# Patient Record
Sex: Female | Born: 1980 | Race: Black or African American | Hispanic: No | Marital: Married | Smoking: Never smoker
Health system: Southern US, Community
[De-identification: ages and names within clinical notes are randomized; demographics above are authoritative.]

## PROBLEM LIST (undated history)

## (undated) DIAGNOSIS — Z124 Encounter for screening for malignant neoplasm of cervix: Secondary | ICD-10-CM

## (undated) DIAGNOSIS — E039 Hypothyroidism, unspecified: Secondary | ICD-10-CM

## (undated) DIAGNOSIS — N926 Irregular menstruation, unspecified: Secondary | ICD-10-CM

## (undated) HISTORY — DX: Irregular menstruation, unspecified: N92.6

## (undated) HISTORY — PX: HERNIA REPAIR: SHX51

## (undated) HISTORY — DX: Hypothyroidism, unspecified: E03.9

## (undated) HISTORY — DX: Encounter for screening for malignant neoplasm of cervix: Z12.4

---

## 2003-10-12 ENCOUNTER — Other Ambulatory Visit: Admission: RE | Admit: 2003-10-12 | Discharge: 2003-10-12 | Payer: Self-pay | Admitting: Obstetrics and Gynecology

## 2004-02-19 ENCOUNTER — Ambulatory Visit (HOSPITAL_COMMUNITY): Admission: RE | Admit: 2004-02-19 | Discharge: 2004-02-19 | Payer: Self-pay | Admitting: Obstetrics & Gynecology

## 2004-06-17 ENCOUNTER — Inpatient Hospital Stay (HOSPITAL_COMMUNITY): Admission: AD | Admit: 2004-06-17 | Discharge: 2004-06-17 | Payer: Self-pay | Admitting: Obstetrics & Gynecology

## 2004-06-17 ENCOUNTER — Inpatient Hospital Stay (HOSPITAL_COMMUNITY): Admission: AD | Admit: 2004-06-17 | Discharge: 2004-06-17 | Payer: Self-pay | Admitting: Obstetrics

## 2004-06-24 ENCOUNTER — Inpatient Hospital Stay (HOSPITAL_COMMUNITY): Admission: AD | Admit: 2004-06-24 | Discharge: 2004-06-24 | Payer: Self-pay | Admitting: Obstetrics & Gynecology

## 2004-06-25 ENCOUNTER — Encounter (INDEPENDENT_AMBULATORY_CARE_PROVIDER_SITE_OTHER): Payer: Self-pay | Admitting: Specialist

## 2004-06-25 ENCOUNTER — Inpatient Hospital Stay (HOSPITAL_COMMUNITY): Admission: AD | Admit: 2004-06-25 | Discharge: 2004-06-27 | Payer: Self-pay | Admitting: Obstetrics & Gynecology

## 2005-07-28 IMAGING — US US FETAL BPP W/O NONSTRESS
1 series · 18 of 28 positions shown · non-contrast
Comparison: none

CLINICAL DATA: 38 week 1 day assigned gestational age.  Non-reactive, non-stress test.  Evaluate biophysical profile.

[Series 1: us fetal bpp w/o nonstress · non-contrast · 18 of 28 slices shown]
[im 1/28]
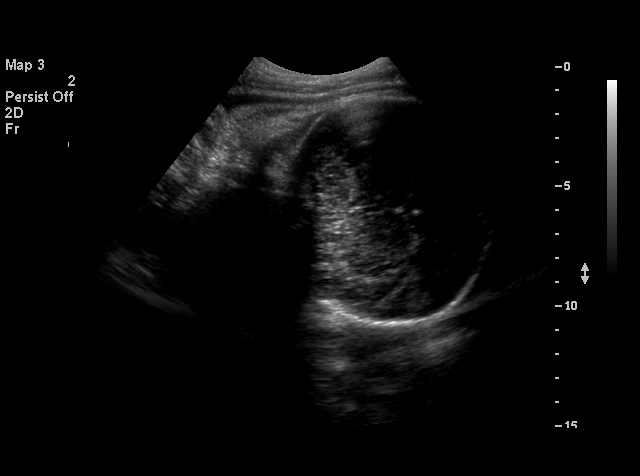
[im 3/28]
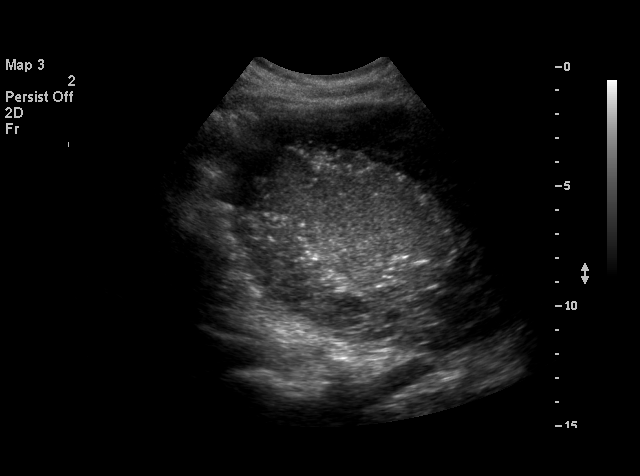
[im 4/28]
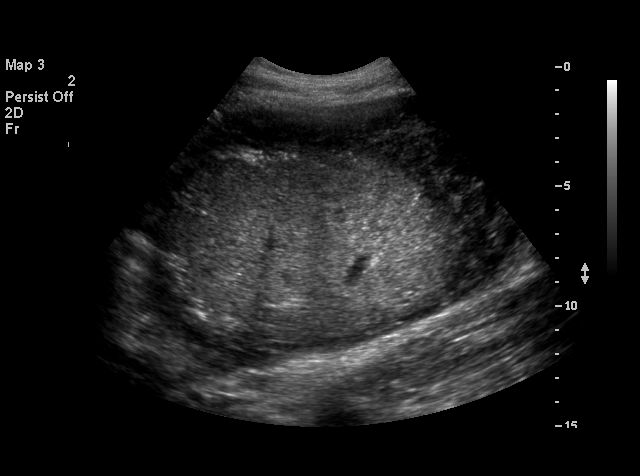
[im 6/28]
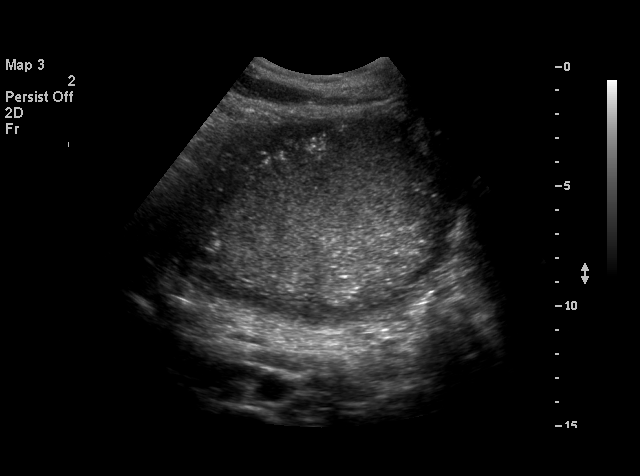
[im 8/28]
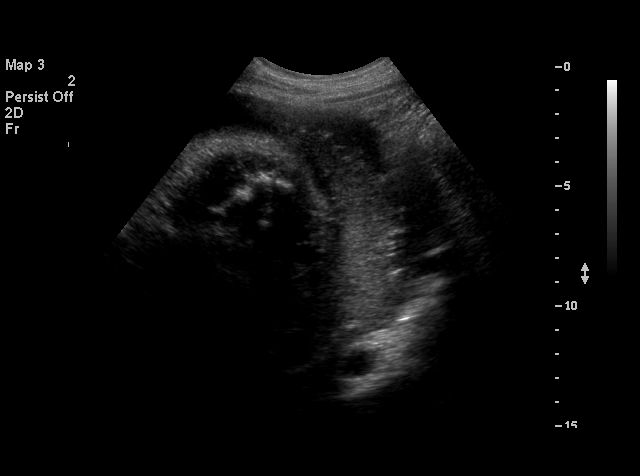
[im 9/28]
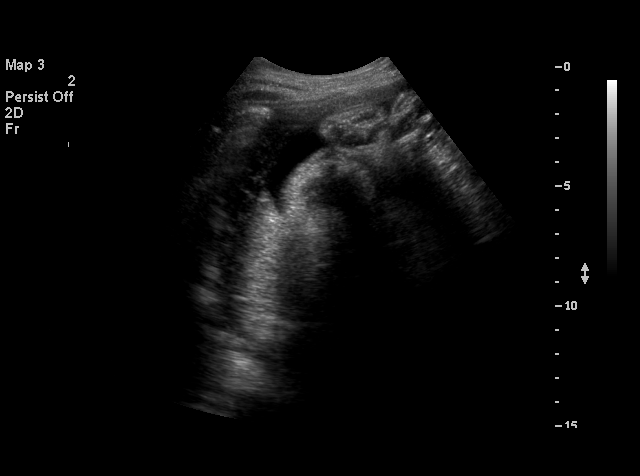
[im 11/28]
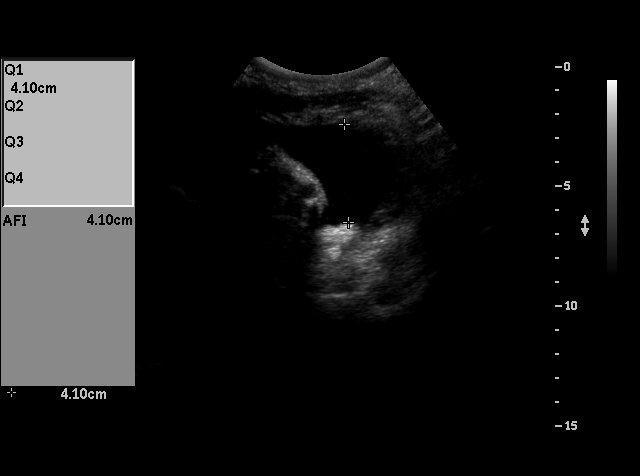
[im 12/28]
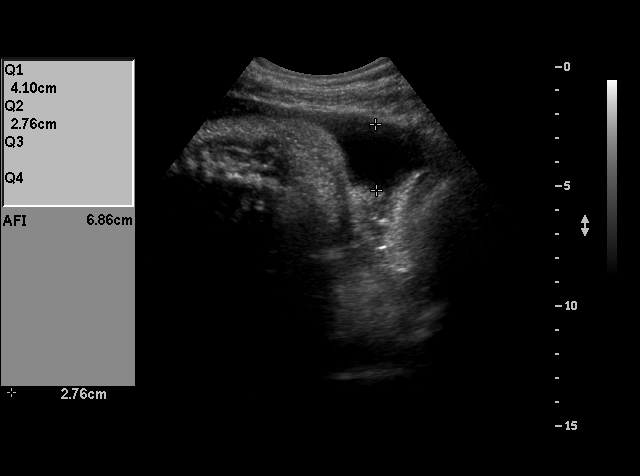
[im 14/28]
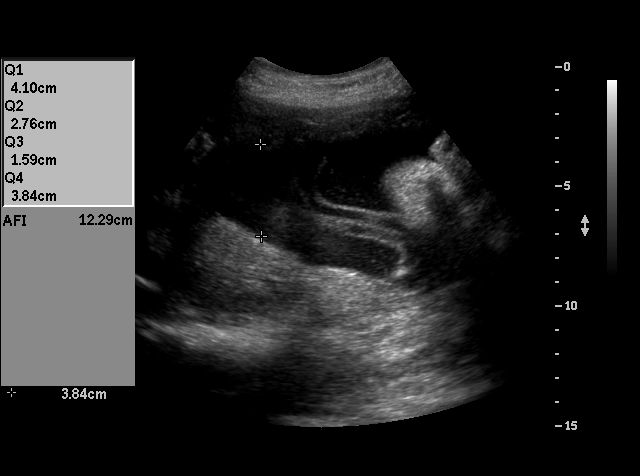
[im 15/28]
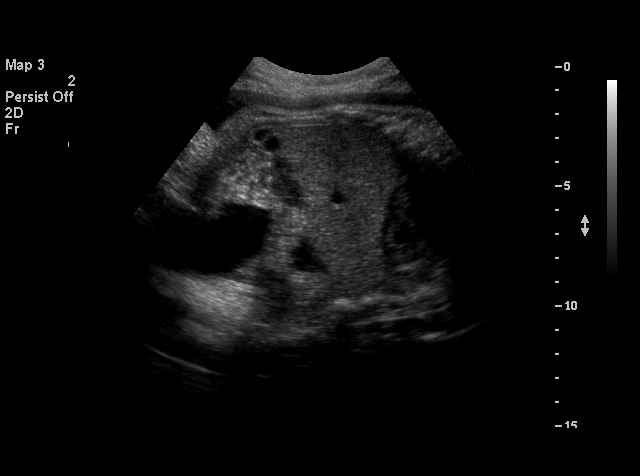
[im 17/28]
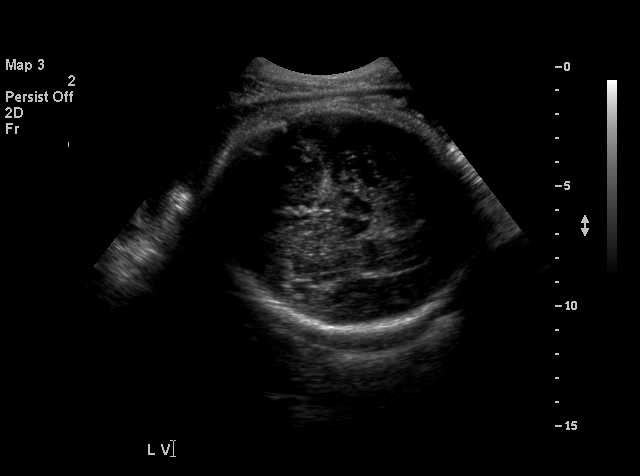
[im 18/28]
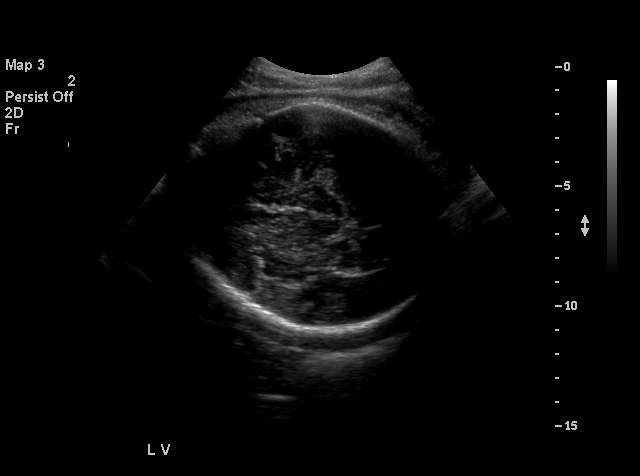
[im 20/28]
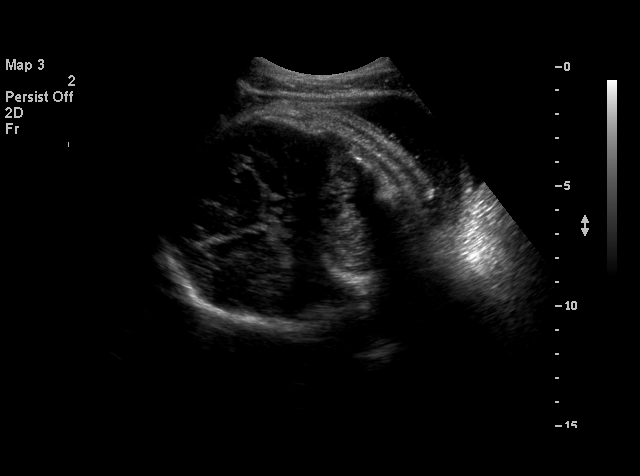
[im 22/28]
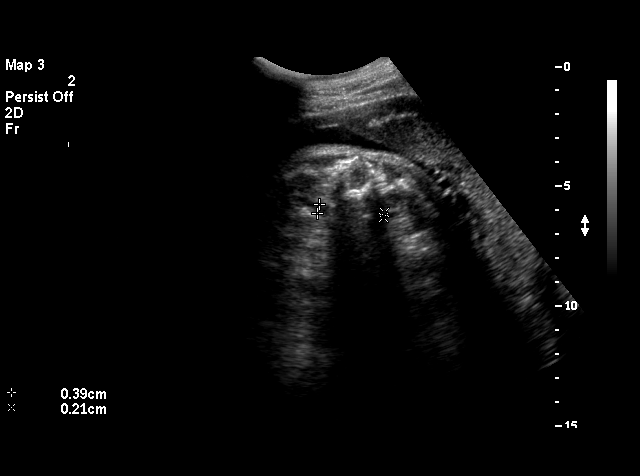
[im 23/28]
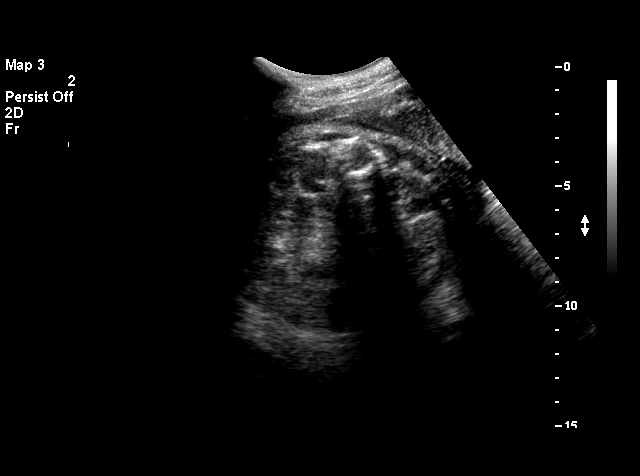
[im 25/28]
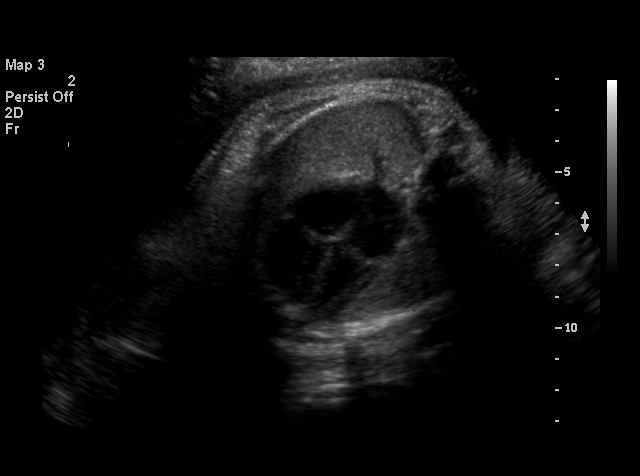
[im 26/28]
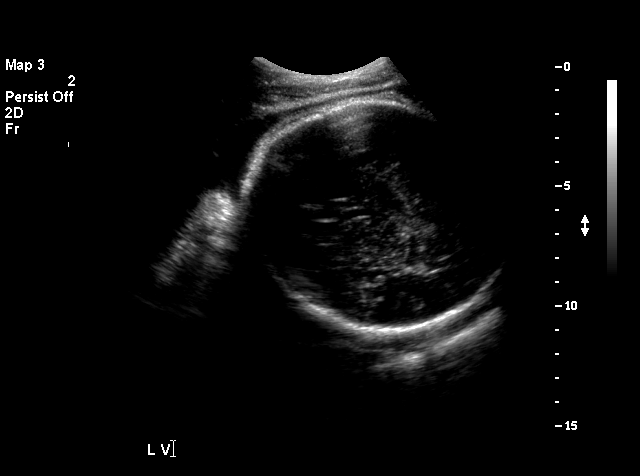
[im 28/28]
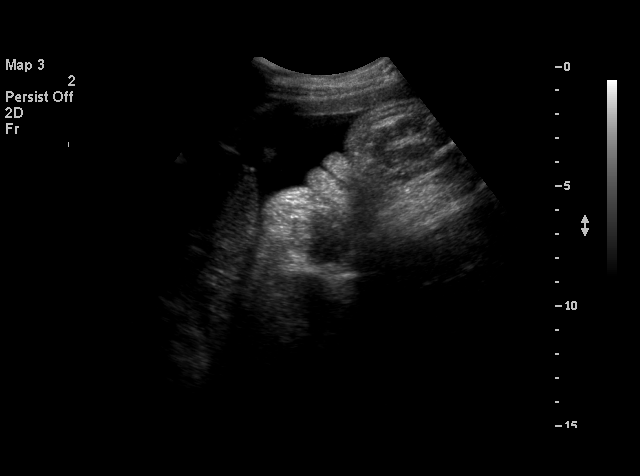

[18 of 28 positions shown; findings below may reference images not displayed]

BIOPHYSICAL PROFILE

 Number of Fetuses: 1
 Heart rate:  143
 Movement:  Yes
 Breathing:  Yes
 Presentation:  Cephalic
 Placental Location:  Posterior
 Grade:  II
 Previa:  No
 Amniotic Fluid (Subjective):  Normal
 Amniotic Fluid (Objective):  12.2 cm AFI (5th -95th%ile =   7.3 ? 23.9 cm for 38 wks)

 Fetal measurements and complete anatomic evaluation were not requested.  The following fetal anatomy was visualized on this exam:  Lateral ventricles, thalami, posterior fossa, four chamber heart, stomach, kidneys, bladder, diaphragm and female genitalia.  

 BPP SCORING
 Movements:  2  Time:  20 minutes
 Breathing:  2
 Tone:  2
 Amniotic Fluid:  2
 Total Score:  8

 MATERNAL UTERINE AND ADNEXAL FINDINGS
 Cervix:  Not evaluated.
IMPRESSION: Single living intrauterine fetus in cephalic presentation.  Biophysical profile score [DATE].

## 2005-08-04 IMAGING — US US FETAL BPP W/O NONSTRESS
1 series · 14 of 23 positions shown · non-contrast
Comparison: none

CLINICAL DATA: 39 weeks gestation with bleeding, question rupture.

[Series 1: us fetal bpp w/o nonstress · 0.35mm/px · 14 of 23 slices shown]
[im 1/23]
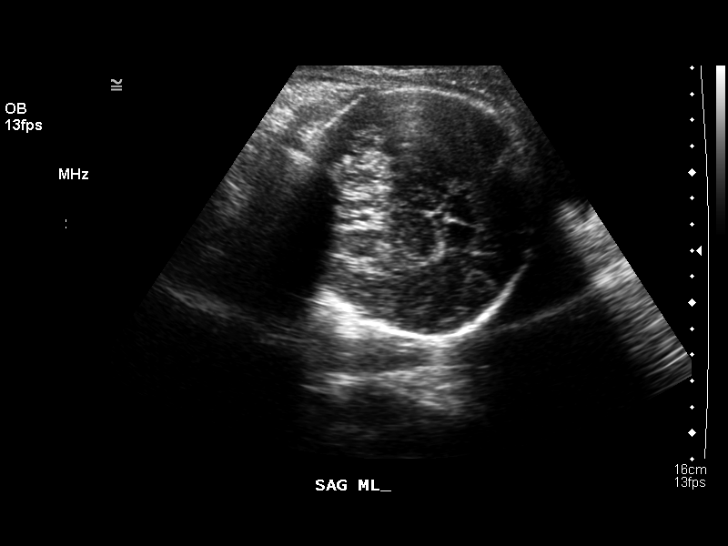
[im 3/23]
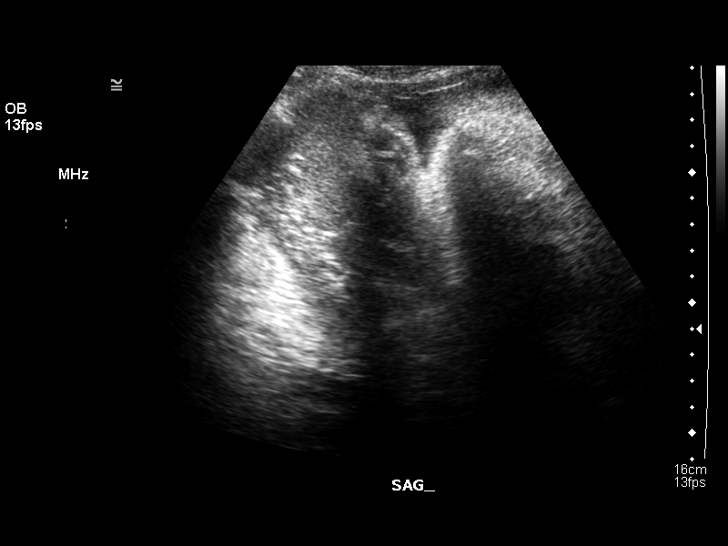
[im 5/23]
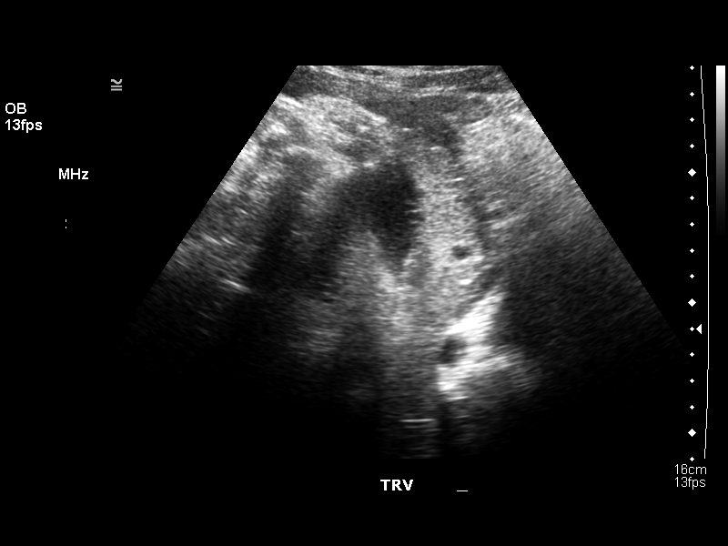
[im 6/23]
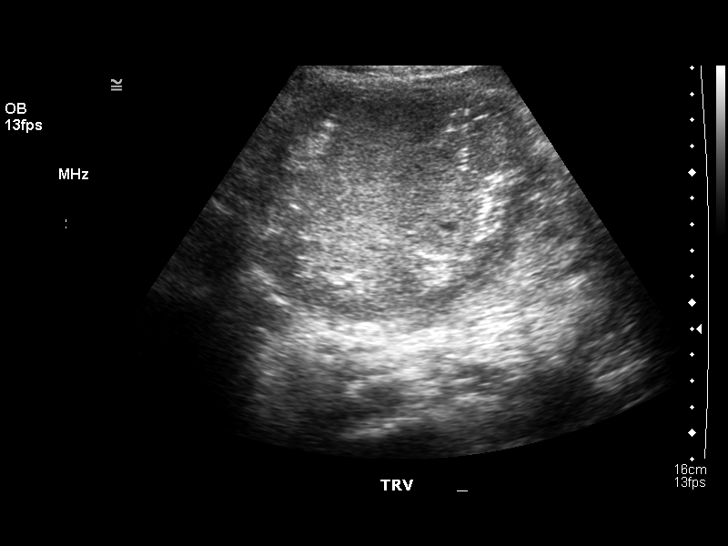
[im 8/23]
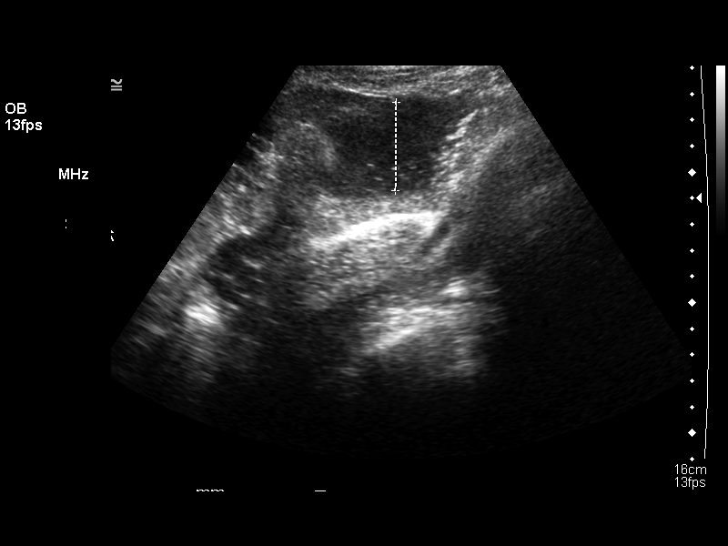
[im 10/23]
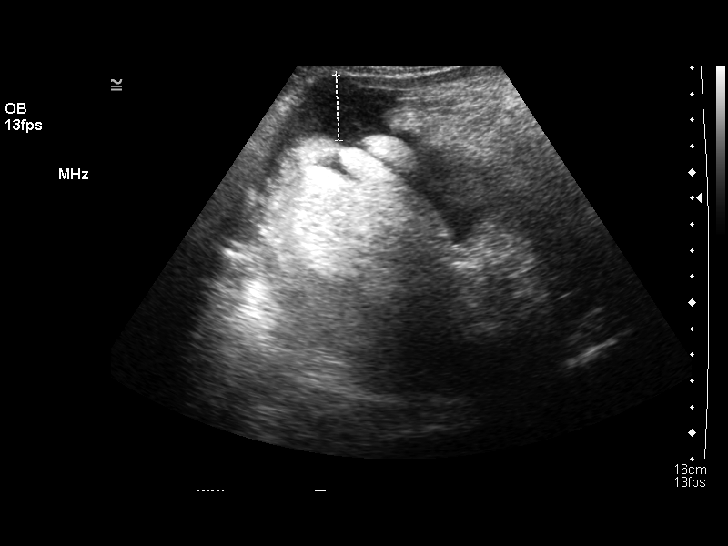
[im 11/23]
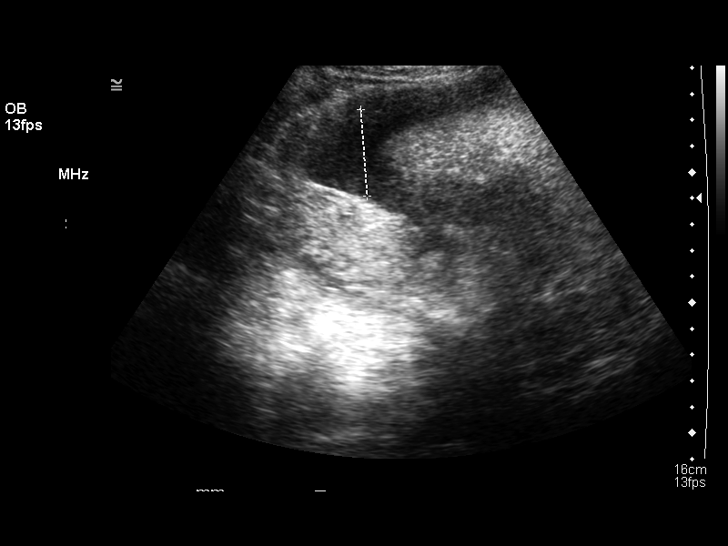
[im 13/23]
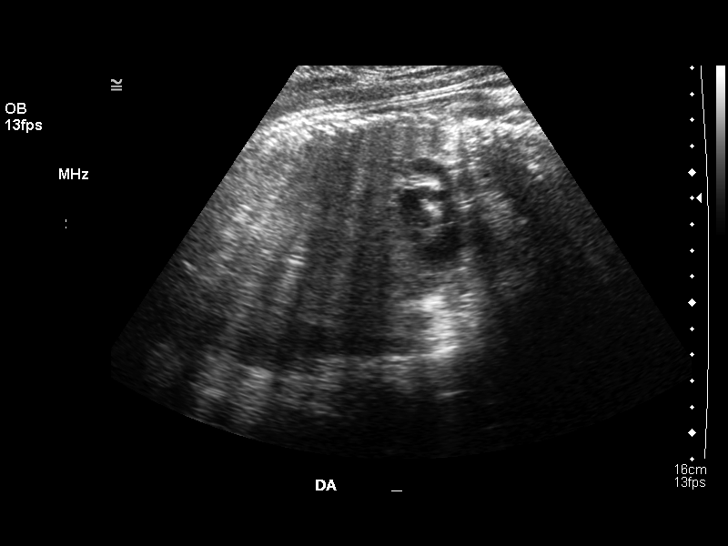
[im 14/23]
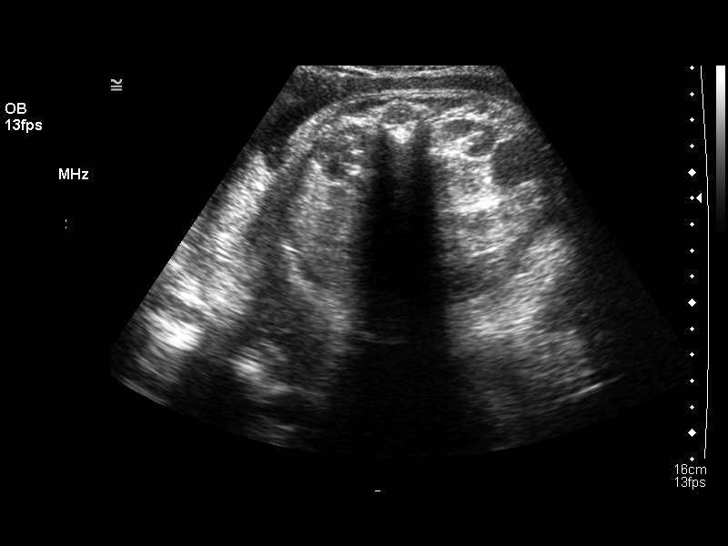
[im 16/23]
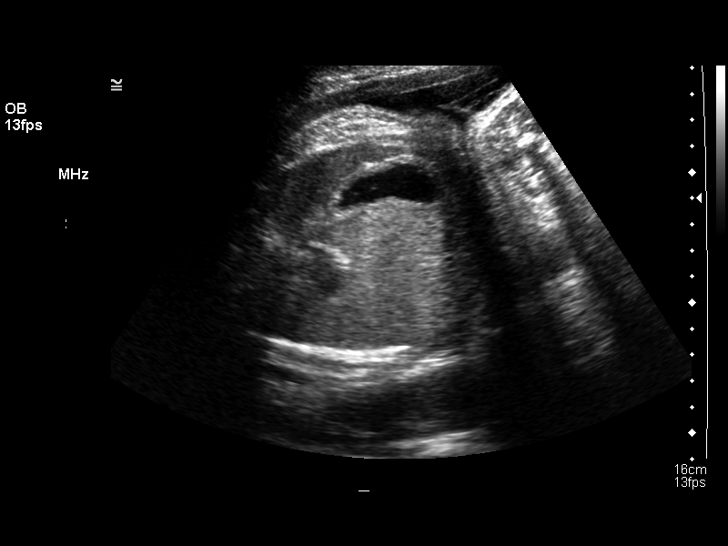
[im 18/23]
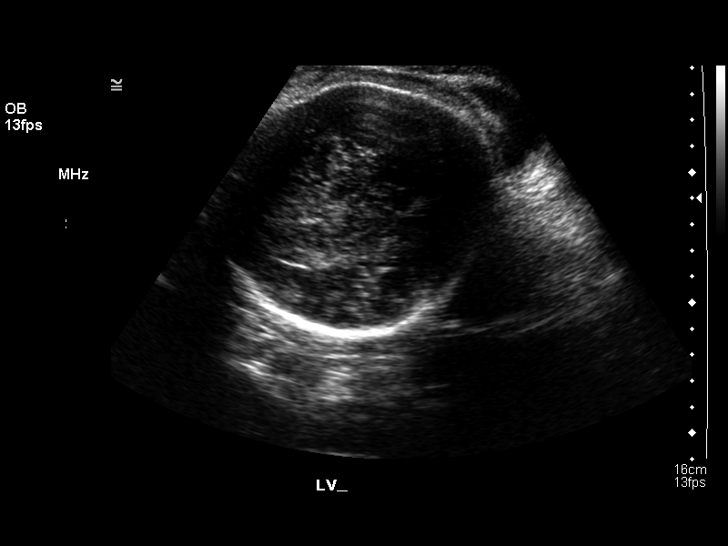
[im 19/23]
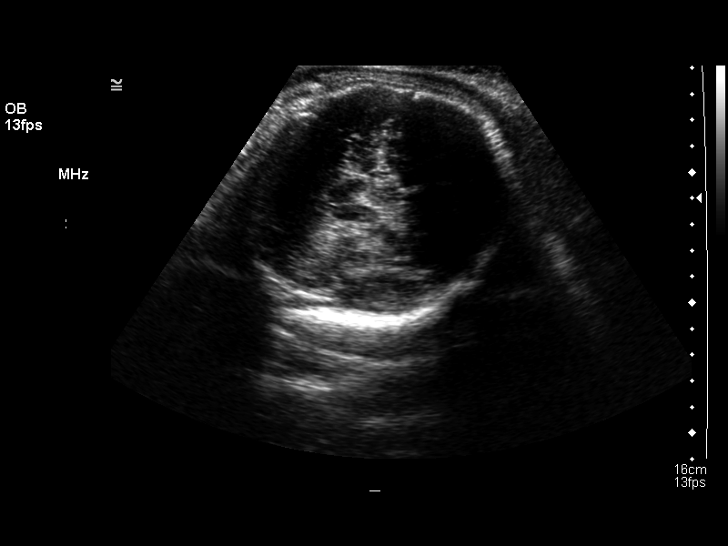
[im 21/23]
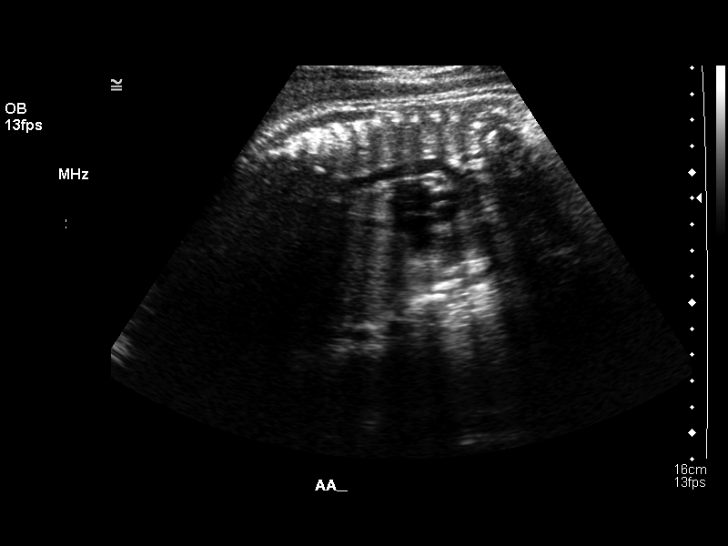
[im 23/23]
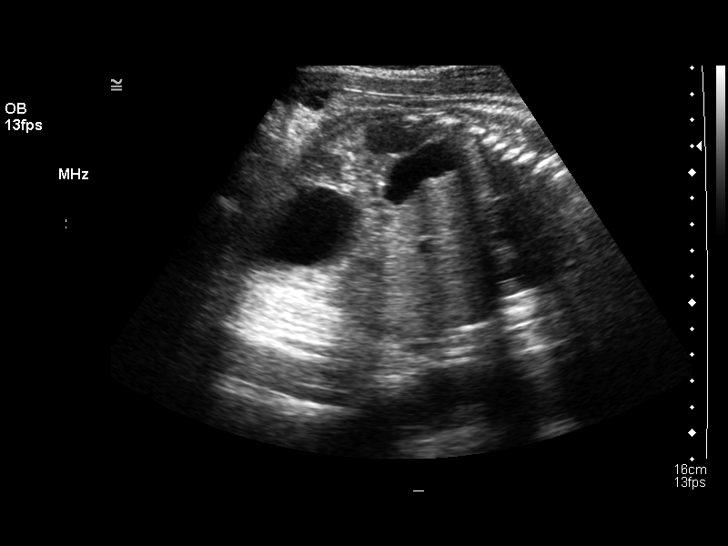

[14 of 23 positions shown; findings below may reference images not displayed]

BIOPHYSICAL PROFILE

 Number of Fetuses:  1
 Heart rate:  147 bpm
 Movement: yes
 Breathing: no
 Presentation:  cephalic
 Placental Location: posterior
 Grade: II
 Previa:  no
 Amniotic Fluid (Subjective):  normal
 Amniotic Fluid (Objective):  AFI 13.0 cm (5th-95th %ile = 7.2 to 22.6 cm for 39 weeks) 

 Fetal measurements and complete anatomic evaluation were not requested.  The following fetal anatomy was visualized on this exam:  lateral ventricles, thalamus, stomach, kidneys, bladder, aortic and ductal arches.
 BPP SCORING
 Movements:  2    Time:  15 minutes
 Breathing:  2
 Tone:  2
 Amniotic Fluid:  2
 Total Score:  8

 MATERNAL UTERINE AND ADNEXAL FINDINGS
 Cervix:  Not evaluated.
IMPRESSION: Normal biophysical profile.  Single living intrauterine fetus in cephalic presentation.

## 2008-01-29 ENCOUNTER — Inpatient Hospital Stay (HOSPITAL_COMMUNITY): Admission: AD | Admit: 2008-01-29 | Discharge: 2008-01-29 | Payer: Self-pay | Admitting: Obstetrics

## 2008-03-25 ENCOUNTER — Inpatient Hospital Stay (HOSPITAL_COMMUNITY): Admission: AD | Admit: 2008-03-25 | Discharge: 2008-03-25 | Payer: Self-pay | Admitting: Obstetrics & Gynecology

## 2008-08-02 ENCOUNTER — Inpatient Hospital Stay (HOSPITAL_COMMUNITY): Admission: AD | Admit: 2008-08-02 | Discharge: 2008-08-02 | Payer: Self-pay | Admitting: Obstetrics

## 2008-09-06 ENCOUNTER — Inpatient Hospital Stay (HOSPITAL_COMMUNITY): Admission: AD | Admit: 2008-09-06 | Discharge: 2008-09-08 | Payer: Self-pay | Admitting: Obstetrics

## 2009-03-10 IMAGING — US US OB COMP LESS 14 WK
1 series · 14 of 27 positions shown · non-contrast
Comparison: none

OBSTETRICAL ULTRASOUND:
 This ultrasound exam was performed in the [HOSPITAL] Ultrasound Department.  The OB US report was generated in the AS system, and faxed to the ordering physician.  This report is also available in [REDACTED] PACS.

[Series 1: us ob comp less 14 wks · 14 of 27 slices shown]
[im 1/27]
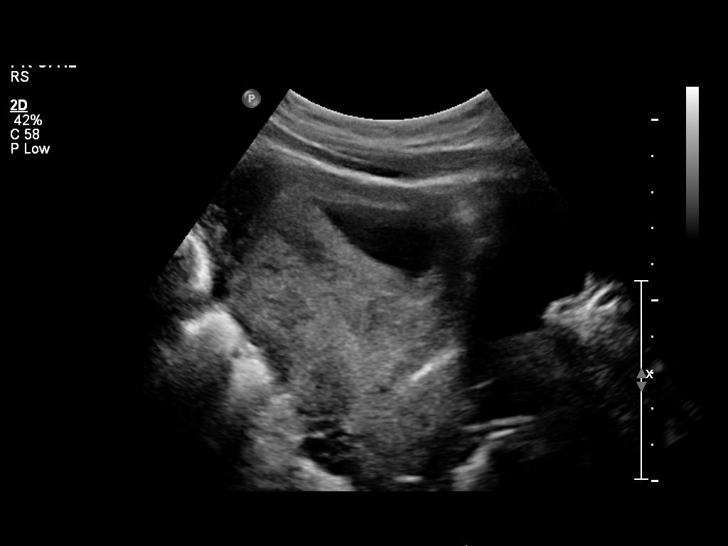
[im 3/27]
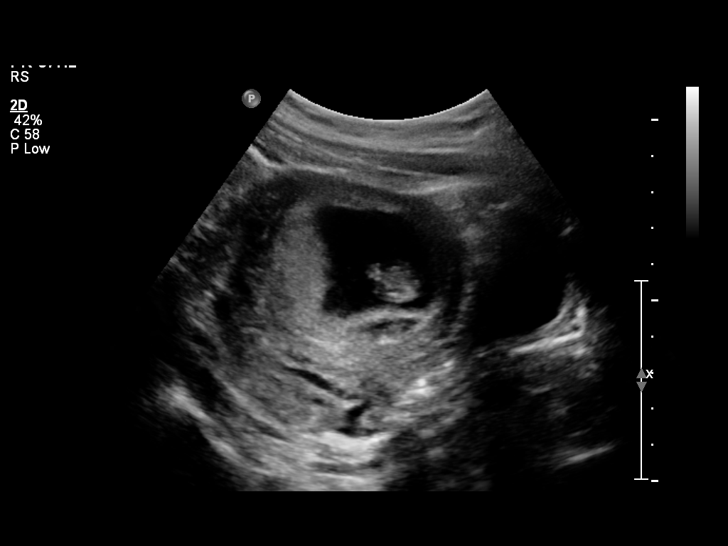
[im 5/27]
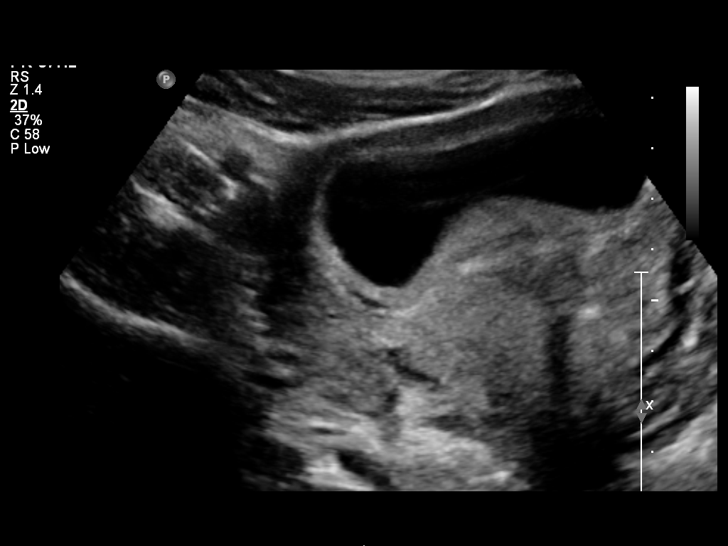
[im 7/27]
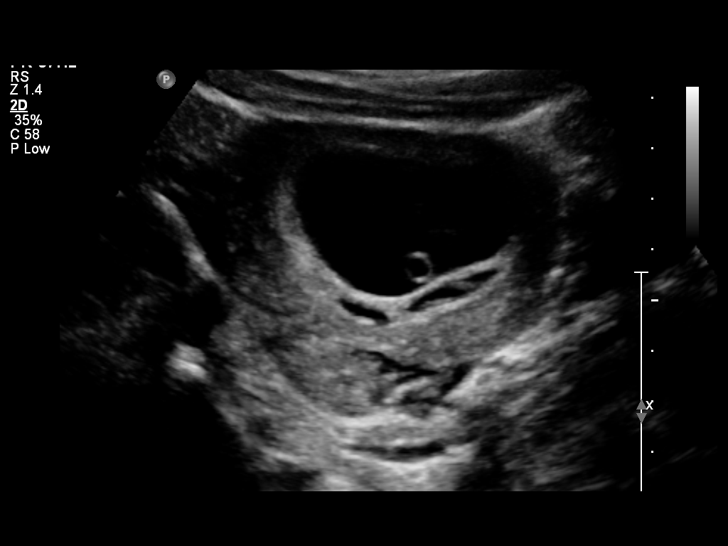
[im 9/27]
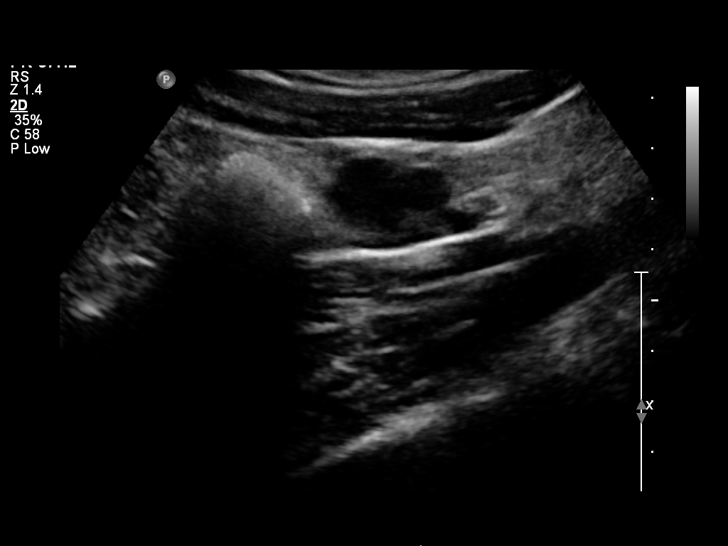
[im 11/27]
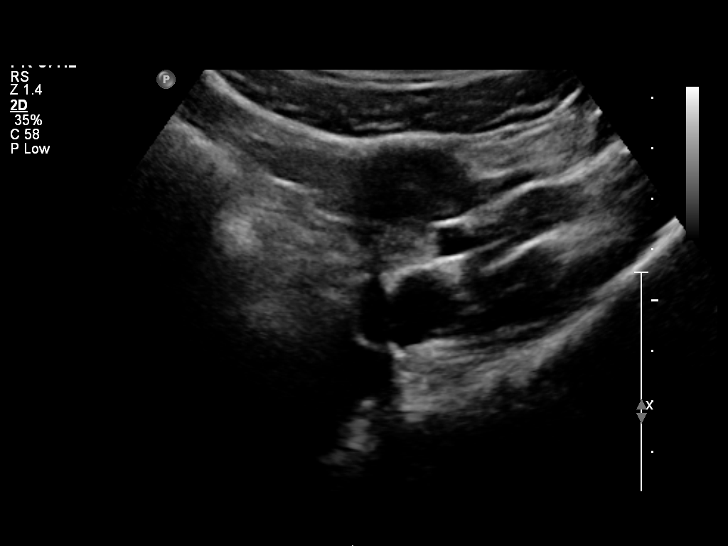
[im 13/27]
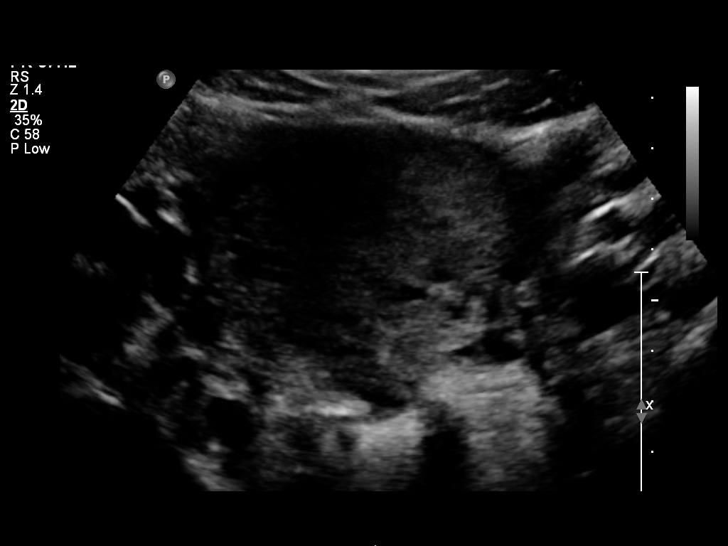
[im 15/27]
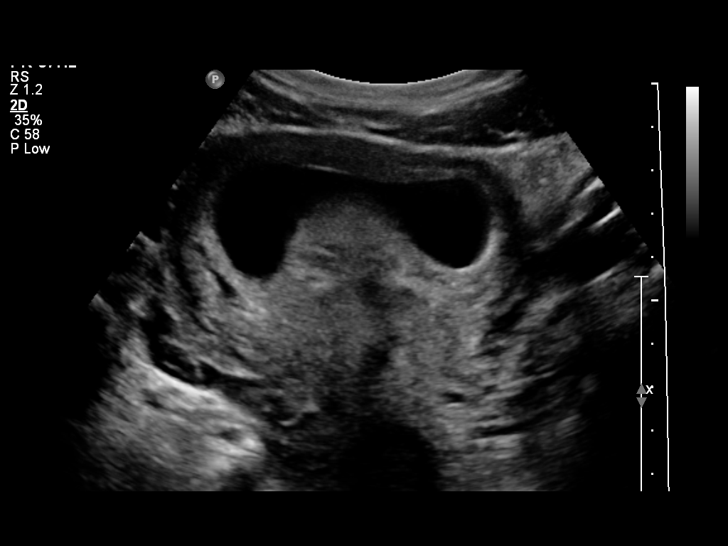
[im 17/27]
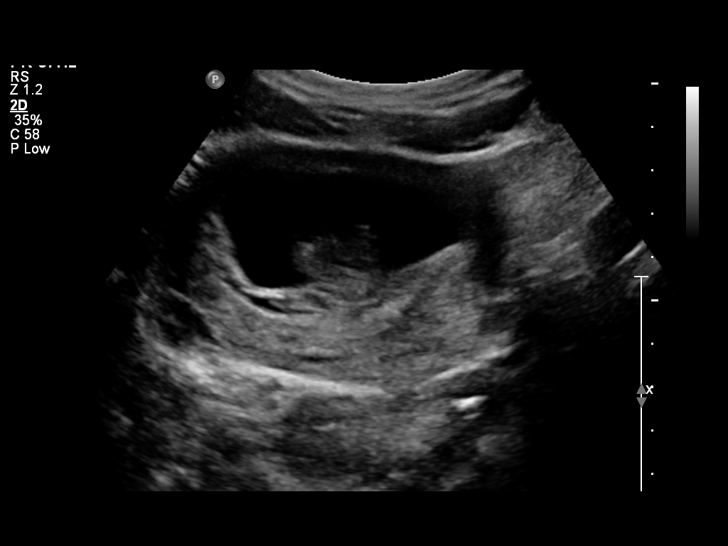
[im 19/27]
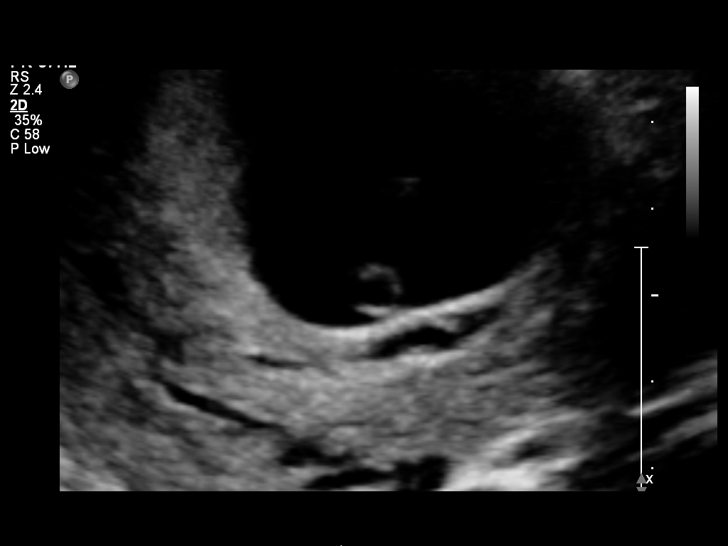
[im 21/27]
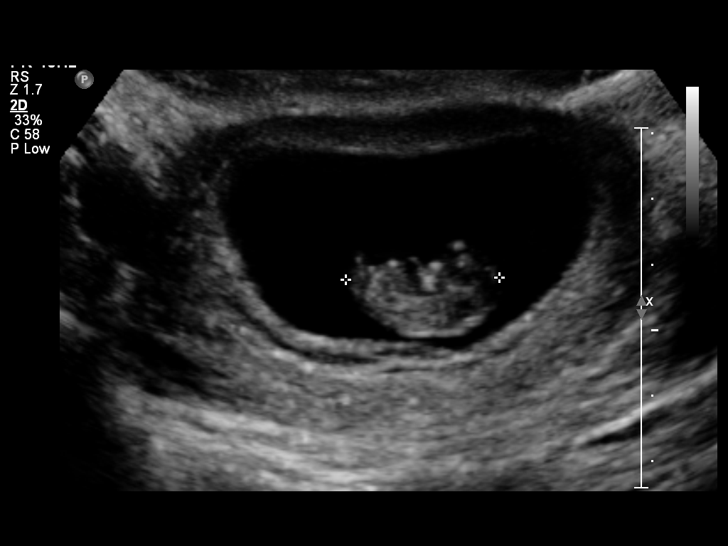
[im 23/27]
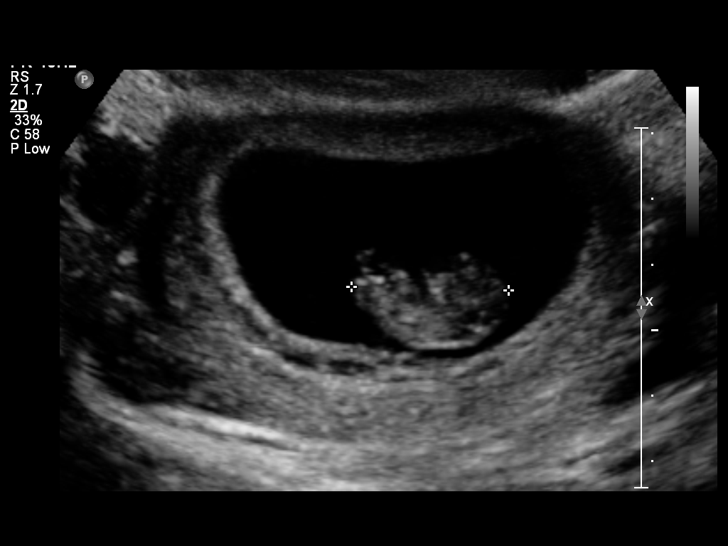
[im 25/27]
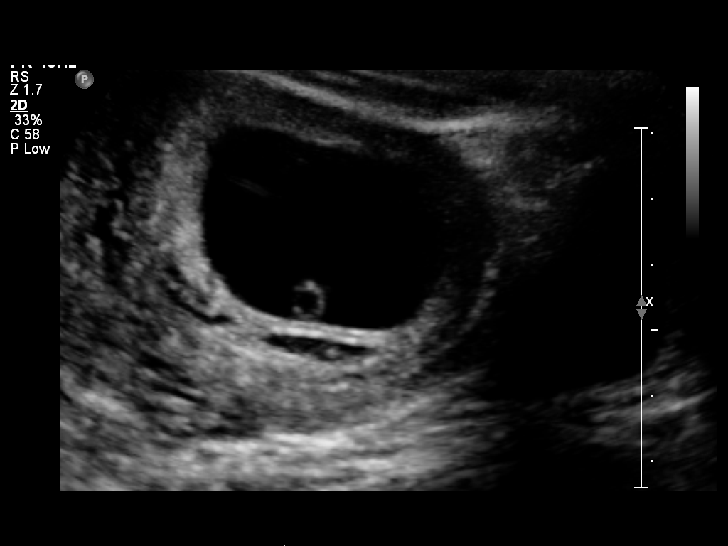
[im 27/27]
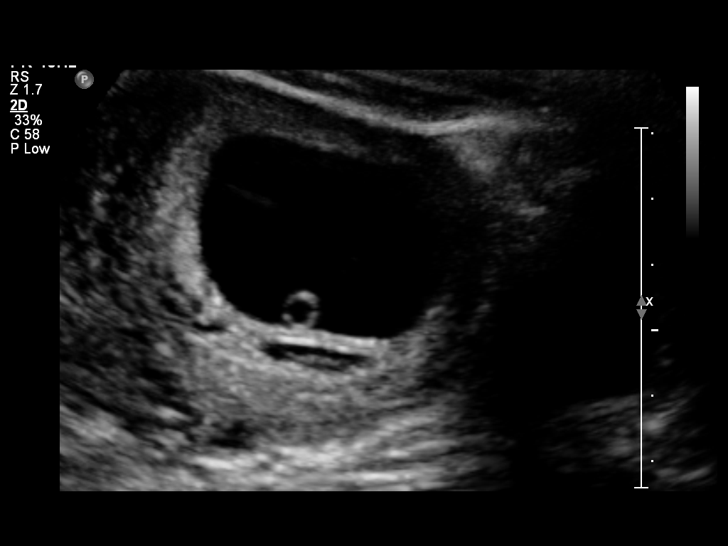

[14 of 27 positions shown; findings below may reference images not displayed]

IMPRESSION: See [facility] Obstetric US report.

## 2010-07-06 DIAGNOSIS — Z124 Encounter for screening for malignant neoplasm of cervix: Secondary | ICD-10-CM

## 2010-07-06 HISTORY — DX: Encounter for screening for malignant neoplasm of cervix: Z12.4

## 2010-07-18 ENCOUNTER — Other Ambulatory Visit (HOSPITAL_COMMUNITY): Payer: Self-pay | Admitting: Family Medicine

## 2010-07-18 DIAGNOSIS — E039 Hypothyroidism, unspecified: Secondary | ICD-10-CM

## 2010-07-26 ENCOUNTER — Ambulatory Visit (HOSPITAL_COMMUNITY)
Admission: RE | Admit: 2010-07-26 | Discharge: 2010-07-26 | Disposition: A | Discharge status: Home / Self Care | Payer: PRIVATE HEALTH INSURANCE | Source: Ambulatory Visit | Attending: Family Medicine | Admitting: Family Medicine

## 2010-07-26 DIAGNOSIS — E039 Hypothyroidism, unspecified: Secondary | ICD-10-CM

## 2010-07-27 ENCOUNTER — Ambulatory Visit (HOSPITAL_COMMUNITY)
Admission: RE | Admit: 2010-07-27 | Discharge: 2010-07-27 | Disposition: A | Discharge status: Home / Self Care | Payer: PRIVATE HEALTH INSURANCE | Source: Ambulatory Visit | Attending: Family Medicine | Admitting: Family Medicine

## 2010-07-27 DIAGNOSIS — E039 Hypothyroidism, unspecified: Secondary | ICD-10-CM | POA: Insufficient documentation

## 2010-07-27 MED ORDER — SODIUM IODIDE I 131 CAPSULE
15.0000 | Freq: Once | INTRAVENOUS | Status: AC | PRN
  Administered 2010-07-26: 15 via ORAL

## 2010-07-27 MED ORDER — SODIUM PERTECHNETATE TC 99M INJECTION
10.0000 | Freq: Once | INTRAVENOUS | Status: AC | PRN
  Administered 2010-07-27: 10 via INTRAVENOUS

## 2010-09-22 LAB — CBC
HCT: 34.8 % — ABNORMAL LOW (ref 36.0–46.0)
Hemoglobin: 11.5 g/dL — ABNORMAL LOW (ref 12.0–15.0)
MCHC: 33.1 g/dL (ref 30.0–36.0)
MCV: 88.4 fL (ref 78.0–100.0)
Platelets: 167 10*3/uL (ref 150–400)
RBC: 3.93 MIL/uL (ref 3.87–5.11)
RBC: 4.49 MIL/uL (ref 3.87–5.11)
RDW: 15.9 % — ABNORMAL HIGH (ref 11.5–15.5)
WBC: 13.3 10*3/uL — ABNORMAL HIGH (ref 4.0–10.5)
WBC: 18.2 10*3/uL — ABNORMAL HIGH (ref 4.0–10.5)

## 2010-09-22 LAB — RPR: RPR Ser Ql: NONREACTIVE

## 2010-10-28 NOTE — H&P (Signed)
NAMELESIA, MONICA              ACCOUNT NO.:  [id number]   MEDICAL RECORD NO.:  [id number]          PATIENT TYPE:  INP   LOCATION:  9166                          FACILITY:  [facility]   PHYSICIAN:  Roseanna Rainbow, M.D.DATE OF BIRTH:  09/01/1980   DATE OF ADMISSION:  06/25/2004  DATE OF DISCHARGE:                                HISTORY & PHYSICAL   CHIEF COMPLAINT:  The patient is a 30 year old gravida 1, para 0, with an  EDC of July 02, 2004, and an intrauterine pregnancy at 39+ weeks,  complaining of uterine contractions and bloody vaginal discharge.   HISTORY OF PRESENT ILLNESS:  Please see the above.  Antepartum course: onset  of care at 11 weeks, fetal pyelectasis.   PAST OBSTETRIC/GYNECOLOGIC HISTORY:  She has a history of Chlamydia.   PAST MEDICAL HISTORY:  She denies.   PAST SURGICAL HISTORY:  Herniorrhaphy.   FAMILY HISTORY:  Chronic hypertension, thyroid dysfunction.   SOCIAL HISTORY:  She is single.  She denies any tobacco, ethanol or  recreational drug use.   ALLERGIES:  BETADINE and IODINE.   PRENATAL SCREENS:  Hemoglobin 12.9, platelets 238,000.  Blood type B  positive, Rh antibody negative.  RPR nonreactive.  Rubella immune.  Hepatitis B surface antigen negative.  HIV nonreactive.  Pap smear within  normal limits.   PHYSICAL EXAMINATION:  VITAL SIGNS:  Blood pressures systolic range between  130s-160s, diastolic 70s-110s.  Fetal heart tracing reviewed, initially good  long-term variability with accelerations, no decelerations.  Now status post  Stadol with diminished long-term variability, no decelerations.  Uterine  contractions every one to two minutes.  PELVIC:  SVE:  Fully dilated, vertex at a +2 station, likely direct OA.  No  molding or caput.   ASSESSMENT:  Primigravida with an intrauterine pregnancy at 39+ weeks.   1.  Rule out pregnancy-induced hypertension.  2.  Active labor, stage 2.   PLAN:  Anticipate spontaneous vaginal delivery.  Will  check a PIH panel and  monitor blood pressures postpartum.      LAJ/MEDQ  D:  06/25/2004  T:  06/25/2004  Job:  [id number]

## 2011-03-13 LAB — URINALYSIS, ROUTINE W REFLEX MICROSCOPIC
Bilirubin Urine: NEGATIVE
Specific Gravity, Urine: 1.025
pH: 6.5

## 2011-03-13 LAB — URINE MICROSCOPIC-ADD ON

## 2011-03-21 ENCOUNTER — Other Ambulatory Visit: Payer: Self-pay | Admitting: Obstetrics & Gynecology

## 2011-03-21 DIAGNOSIS — N644 Mastodynia: Secondary | ICD-10-CM

## 2011-03-28 ENCOUNTER — Other Ambulatory Visit: Payer: PRIVATE HEALTH INSURANCE

## 2012-08-31 ENCOUNTER — Encounter: Payer: Self-pay | Admitting: *Deleted

## 2012-09-03 ENCOUNTER — Ambulatory Visit: Payer: Self-pay | Admitting: Obstetrics & Gynecology

## 2012-11-20 ENCOUNTER — Ambulatory Visit: Payer: PRIVATE HEALTH INSURANCE | Admitting: Obstetrics & Gynecology

## 2013-02-07 ENCOUNTER — Other Ambulatory Visit: Payer: Self-pay | Admitting: *Deleted

## 2013-02-07 MED ORDER — LEVOTHYROXINE SODIUM 100 MCG PO TABS: 100.0000 ug | ORAL_TABLET | Freq: Every day | ORAL | Status: DC

## 2013-02-12 ENCOUNTER — Encounter: Payer: Self-pay | Admitting: Obstetrics & Gynecology

## 2013-02-12 ENCOUNTER — Ambulatory Visit (INDEPENDENT_AMBULATORY_CARE_PROVIDER_SITE_OTHER): Payer: BC Managed Care – PPO | Admitting: Obstetrics & Gynecology

## 2013-02-12 VITALS — BP 118/88 | HR 76 | Temp 99.4°F | Ht 64.0 in | Wt 185.0 lb

## 2013-02-12 DIAGNOSIS — Z01419 Encounter for gynecological examination (general) (routine) without abnormal findings: Secondary | ICD-10-CM

## 2013-02-12 DIAGNOSIS — E039 Hypothyroidism, unspecified: Secondary | ICD-10-CM

## 2013-02-12 LAB — CBC
MCH: 28.5 pg (ref 26.0–34.0)
Platelets: 273 10*3/uL (ref 150–400)
RBC: 4.53 MIL/uL (ref 3.87–5.11)
RDW: 13.6 % (ref 11.5–15.5)
WBC: 11.5 10*3/uL — ABNORMAL HIGH (ref 4.0–10.5)

## 2013-02-12 LAB — HIV ANTIBODY (ROUTINE TESTING W REFLEX): HIV: NONREACTIVE

## 2013-02-12 LAB — T4, FREE: Free T4: 0.7 ng/dL — ABNORMAL LOW (ref 0.80–1.80)

## 2013-02-12 NOTE — Progress Notes (Signed)
Subjective:     GENELLE ECONOMOU is a 32 y.o. female here for a routine exam.  Current complaints: annual exam. Pt is requesting a referral to an endocrinologist. Pt is requesting a general health panel and STD testing.  Personal health questionnaire reviewed: yes.   Gynecologic History Patient's last menstrual period was 12/02/2012. Contraception: none Last Pap: 2012. Results were: normal   Obstetric History OB History  No data available     The following portions of the patient's history were reviewed and updated as appropriate: allergies, current medications, past family history, past medical history, past social history, past surgical history and problem list.  Review of Systems Pertinent items are noted in HPI.    Objective:    General appearance: alert Breasts: normal appearance, no masses or tenderness Abdomen: soft, non-tender; bowel sounds normal; no masses,  no organomegaly Pelvic: cervix normal in appearance, external genitalia normal, no adnexal masses or tenderness, uterus normal size, shape, and consistency and vagina normal without discharge      Assessment:    Healthy female exam.   Considering attempting to conceive Plan:   Preconception counseling provided Referral to Endocrinologist Return prn

## 2013-02-13 LAB — PAP IG W/ RFLX HPV ASCU

## 2013-02-14 ENCOUNTER — Encounter: Payer: Self-pay | Admitting: Obstetrics & Gynecology

## 2013-02-14 DIAGNOSIS — E039 Hypothyroidism, unspecified: Secondary | ICD-10-CM | POA: Insufficient documentation

## 2013-02-14 NOTE — Patient Instructions (Signed)
Preparing for Pregnancy Preparing for pregnancy (preconceptual care) by getting counseling and information from your caregiver before getting pregnant is a good idea. It will help you and your baby have a better chance to have a healthy, safe pregnancy and delivery of your baby. Make an appointment with your caregiver to talk about your health, medical, and family history and how to prepare yourself before getting pregnant. Your caregiver will do a complete physical exam and a Pap test. They will want to know:  About you, your spouse or partner, and your family's medical and genetic history.  If you are eating a balanced diet and drinking enough fluids.  What vitamins and mineral supplements you are taking. This includes taking folic acid before getting pregnant to help prevent birth defects.  What medications you are taking including prescription, over-the-counter and herbal medications.  If there is any substance abuse like alcohol, smoking, and illegal drugs.  If there is any mental or physical domestic violence.  If there is any risk of sexually transmitted disease between you and your partner.  What immunizations and vaccinations you have had and what you may need before getting pregnant.  If you should get tested for HIV infection.  If there is any exposure to chemical or toxic substances at home or work.  If there are medical problems you have that need to be treated and kept under control before getting pregnant such as diabetes, high blood pressure or others.  If there were any past surgeries, pregnancies and problems with them.  What your current weight is and to set a goal as to how much weight you should gain while pregnant. Also, they will check if you should lose or gain weight before getting pregnant.  What is your exercise routine and what it is safe when you are pregnant.  If there are any physical disabilities that need to be addressed.  About spacing your  pregnancies when there are other children.  If there is a financial problem that may affect you having a child. After talking about the above points with your caregiver, your caregiver will give you advice on how to help treat and work with you on solving any issues, if necessary, before getting pregnant. The goal is to have a healthy and safe pregnancy for you and your baby. You should keep an accurate record of your menstrual periods because it will help in determining your due date. Immunizations that you should have before getting pregnant:   Regular measles, German measles (rubella) and mumps.  Tetanus and diphtheria.  Chickenpox, if not immune.  Herpes zoster (Varicella) if not immune.  Human papilloma virus vaccine (HPV) between the age of 9 and 26 years old.  Hepatitis A vaccine.  Hepatitis B vaccine.  Influenza vaccine.  Pneumococcal vaccine (pneumonia). You should avoid getting pregnant for one month after getting vaccinated with a live virus vaccine such as German measles (rubella) vaccine. Other immunizations may be necessary depending on where you live, such as malaria. Ask your caregiver if any other immunizations are needed for you. HOME CARE INSTRUCTIONS   Follow the advice of your caregiver.  Before getting pregnant:  Begin taking vitamins, supplements, and 0.4 milligrams folic acid daily.  Get your immunizations up-to-date.  Get help from a nutrition counselor if you do not understand what a balanced diet is, need help with a special medical diet or if you need help to lose or gain weight.  Begin exercising.  Stop smoking, taking illegal drugs,   and drinking alcoholic beverages.  Get counseling if there is and type of domestic violence.  Get checked for sexually transmitted diseases including HIV.  Get any medical problems under control (diabetes, high blood pressure, convulsions, asthma or others).  Resolve any financial concerns.  Be sure you and  your spouse or partner are ready to have a baby.  Keep an accurate record of your menstrual periods. Document Released: 05/11/2008 Document Revised: 08/21/2011 Document Reviewed: 05/11/2008 [employer] Patient Information 2014 [employer].  

## 2013-02-16 LAB — RUBELLA ANTIBODY, IGM: Rubella IgM: 0.41 (ref <0.90)

## 2013-02-24 ENCOUNTER — Encounter: Payer: Self-pay | Admitting: Obstetrics

## 2013-03-10 ENCOUNTER — Ambulatory Visit (INDEPENDENT_AMBULATORY_CARE_PROVIDER_SITE_OTHER): Payer: BC Managed Care – PPO | Admitting: Endocrinology

## 2013-03-10 VITALS — BP 118/70 | HR 90 | Ht 63.0 in | Wt 182.0 lb

## 2013-03-10 DIAGNOSIS — E039 Hypothyroidism, unspecified: Secondary | ICD-10-CM

## 2013-03-10 MED ORDER — LEVOTHYROXINE SODIUM 100 MCG PO TABS: 100.0000 ug | ORAL_TABLET | Freq: Every day | ORAL | Status: DC

## 2013-03-10 NOTE — Patient Instructions (Addendum)
Please continue the same thyroid medication. Please redo the blood test in 1 month.  Please call a day ahead, so we can expect you. In view of your medical condition, you should avoid pregnancy until we have decided it is safe.

## 2013-03-10 NOTE — Progress Notes (Signed)
Subjective:    Patient ID: Sophia Barron, female    DOB: [birth year], 32 y.o.   MRN: [id number]  HPI Pt states she was dx'ed with primary hypothyroidism in 2012.  She was rx'ed with synthroid.  She has been on 100 mcg/day, since 2012.  She has slight flushing sensation in the face, and assoc difficulty losing weight, since OC's were stopped a few mos ago.  When she had TSH a few weeks ago, she has been taking synthroid intermittently.  However, she now takes 1 qd as rx'ed.  She says she is actively avoiding pregnancy now, but would like to start a pregnancy within the next year or so.   Past Medical History  Diagnosis Date  . Unspecified hypothyroidism   . Irregular menstrual cycle   . Pap smear for cervical cancer screening 01.25.2012    normal    Past Surgical History  Procedure Laterality Date  . Hernia repair      History   Social History  . Marital Status: Married    Spouse Name: N/A    Number of Children: N/A  . Years of Education: N/A   Occupational History  . Not on file.   Social History Main Topics  . Smoking status: Not on file  . Smokeless tobacco: Not on file  . Alcohol Use: Yes     Comment: ocassionally  . Drug Use: No  . Sexual Activity: Yes    Birth Control/ Protection: Condom     Comment: Sometimes   Other Topics Concern  . Not on file   Social History Narrative  . No narrative on file    Current Outpatient Prescriptions on File Prior to Visit  Medication Sig Dispense Refill  . levothyroxine (SYNTHROID, LEVOTHROID) 100 MCG tablet Take 1 tablet (100 mcg total) by mouth daily.  30 tablet  0   No current facility-administered medications on file prior to visit.    Allergies  Allergen Reactions  . Iodinated Diagnostic Agents    Family History  Problem Relation Age of Onset  . Hypothyroidism Sister    BP 118/70  Pulse 90  Ht 5\' 3"  (1.6 m)  Wt 182 lb (82.555 kg)  BMI 32.25 kg/m2  SpO2 97%  LMP 12/02/2012  Review of Systems denies  cramps, sob, fever, memory loss, constipation, numbness, blurry vision, dry skin, rhinorrhea, easy bruising, and syncope.  She has mood swings, arthralgias, cold intolerance, and hair loss.  Since off OC's, she has menses approx every other month.     Objective:   Physical Exam VS: see vs page GEN: no distress HEAD: head: no deformity eyes: no periorbital swelling, no proptosis external nose and ears are normal mouth: no lesion seen NECK: supple, thyroid is not enlarged CHEST WALL: no deformity. LUNGS:  Clear to auscultation. CV: reg rate and rhythm, no murmur. ABD: abdomen is soft, nontender.  no hepatosplenomegaly.  not distended.  no hernia. MUSCULOSKELETAL: muscle bulk and strength are grossly normal.  no obvious joint swelling.  gait is normal and steady.   EXTEMITIES: no deformity.  no ulcer on the feet.  feet are of normal color and temp.  no edema PULSES: dorsalis pedis intact bilat.   NEURO:  cn 2-12 grossly intact.   readily moves all 4's.  sensation is intact to touch on the feet SKIN:  Normal texture and temperature.  No rash or suspicious lesion is visible.   NODES:  None palpable at the neck.   PSYCH: alert, oriented x3.  Does not appear anxious nor depressed.   Lab Results  Component Value Date   TSH 27.380* 02/12/2013      Assessment & Plan:  Chronic primary hypothyroidism: she needs increased rx Noncompliance with rx.  Pt says this is better now, but i can concerned in view of a possible upcoming pregnancy. Cold intolerance, and other sxs, possibly thyroid-related

## 2013-04-11 ENCOUNTER — Other Ambulatory Visit: Payer: BC Managed Care – PPO

## 2013-04-20 ENCOUNTER — Other Ambulatory Visit: Payer: Self-pay | Admitting: *Deleted

## 2013-04-20 DIAGNOSIS — N926 Irregular menstruation, unspecified: Secondary | ICD-10-CM

## 2013-04-23 ENCOUNTER — Ambulatory Visit: Payer: BC Managed Care – PPO | Admitting: Obstetrics & Gynecology

## 2013-04-23 ENCOUNTER — Other Ambulatory Visit: Payer: BC Managed Care – PPO

## 2013-04-24 ENCOUNTER — Ambulatory Visit: Payer: BC Managed Care – PPO | Admitting: Obstetrics & Gynecology

## 2013-07-03 ENCOUNTER — Other Ambulatory Visit: Payer: Self-pay | Admitting: Endocrinology

## 2013-07-08 ENCOUNTER — Encounter: Payer: Self-pay | Admitting: Obstetrics & Gynecology

## 2013-08-07 ENCOUNTER — Other Ambulatory Visit: Payer: Self-pay | Admitting: Endocrinology

## 2013-09-29 ENCOUNTER — Other Ambulatory Visit: Payer: Self-pay | Admitting: Endocrinology

## 2013-10-30 ENCOUNTER — Telehealth: Payer: Self-pay | Admitting: *Deleted

## 2013-10-30 NOTE — Telephone Encounter (Signed)
Patient requested call back- she c/o leg pain and thinks that it is related to an ovarian cyst. Patient has an appointment on 11/12/2013 for follow up. Patient LMP 10/27/2013 and she has no abdominal pain. Some light "pressure " at times, but no acute pain. Encourages patient to keep appointment and will evaluate then. FYI: Patient no showed US scheduled 04/23/2013.

## 2013-11-12 ENCOUNTER — Ambulatory Visit (INDEPENDENT_AMBULATORY_CARE_PROVIDER_SITE_OTHER): Payer: BC Managed Care – PPO | Admitting: Obstetrics & Gynecology

## 2013-11-12 ENCOUNTER — Encounter: Payer: Self-pay | Admitting: Obstetrics & Gynecology

## 2013-11-12 VITALS — BP 120/79 | HR 73 | Temp 98.0°F | Ht 64.0 in | Wt 186.0 lb

## 2013-11-12 DIAGNOSIS — Z113 Encounter for screening for infections with a predominantly sexual mode of transmission: Secondary | ICD-10-CM

## 2013-11-12 DIAGNOSIS — Z309 Encounter for contraceptive management, unspecified: Secondary | ICD-10-CM

## 2013-11-12 MED ORDER — NORETHIN ACE-ETH ESTRAD-FE 1-20 MG-MCG(24) PO TABS: 1.0000 | ORAL_TABLET | Freq: Every day | ORAL | Status: AC

## 2013-11-13 LAB — GC/CHLAMYDIA PROBE AMP
CT PROBE, AMP APTIMA: NEGATIVE
GC Probe RNA: NEGATIVE

## 2013-11-21 NOTE — Progress Notes (Signed)
Patient ID: Sophia Barron, female   DOB: 1981-04-10, 33 y.o.   MRN: [id number]  Chief Complaint  Patient presents with  . Follow-up    HPI Sophia Barron is a 33 y.o. female.  H/O irregular menses in the setting of thyroid dysfunction.  HPI  Past Medical History  Diagnosis Date  . Unspecified hypothyroidism   . Irregular menstrual cycle   . Pap smear for cervical cancer screening 01.25.2012    normal    Past Surgical History  Procedure Laterality Date  . Hernia repair      Family History  Problem Relation Age of Onset  . Hypothyroidism Sister     Social History History  Substance Use Topics  . Smoking status: Never Smoker   . Smokeless tobacco: Not on file  . Alcohol Use: Yes     Comment: ocassionally    Allergies  Allergen Reactions  . Iodinated Diagnostic Agents     Current Outpatient Prescriptions  Medication Sig Dispense Refill  . levothyroxine (SYNTHROID, LEVOTHROID) 100 MCG tablet TAKE 1 TABLET BY MOUTH EVERY DAY  30 tablet  0  . Norethindrone Acetate-Ethinyl Estrad-FE (LOESTRIN 24 FE) 1-20 MG-MCG(24) tablet Take 1 tablet by mouth daily.  1 Package  11   No current facility-administered medications for this visit.    Review of Systems Review of Systems Constitutional: negative for fatigue and weight loss Respiratory: negative for cough and wheezing Cardiovascular: negative for chest pain, fatigue and palpitations Gastrointestinal: negative for abdominal pain and change in bowel habits Genitourinary: positive for abnormal uterine bleeding Integument/breast: negative for nipple discharge Musculoskeletal:negative for myalgias Neurological: negative for gait problems and tremors Behavioral/Psych: negative for abusive relationship, depression Endocrine: negative for temperature intolerance     Blood pressure 120/79, pulse 73, temperature 98 F (36.7 C), height 5\' 4"  (1.626 m), weight 84.369 kg (186 lb), last menstrual period  11/10/2013.  Physical Exam Physical Exam General:   alert  Abdomen:  normal findings: no organomegaly, soft, non-tender and no hernia  Pelvis:  External genitalia: normal general appearance Urinary system: urethral meatus normal and bladder without fullness, nontender Vaginal: normal without tenderness, induration or masses Cervix: normal appearance Adnexa: normal bimanual exam Uterus: anteverted and non-tender, normal size    50% of 15 min visit spent on counseling and coordination of care.   Data Reviewed None   Assessment    AUB, likely-O    Plan    Orders Placed This Encounter  Procedures  . GC/Chlamydia Probe Amp   Meds ordered this encounter  Medications  . Norethindrone Acetate-Ethinyl Estrad-FE (LOESTRIN 24 FE) 1-20 MG-MCG(24) tablet    Sig: Take 1 tablet by mouth daily.    Dispense:  1 Package    Refill:  11   Follow up as needed.         JACKSON-MOORE,Shirin Echeverry A 11/21/2013, 7:29 PM

## 2014-02-12 ENCOUNTER — Ambulatory Visit: Payer: BC Managed Care – PPO | Admitting: Obstetrics & Gynecology

## 2014-06-08 ENCOUNTER — Encounter: Payer: Self-pay | Admitting: *Deleted

## 2014-06-09 ENCOUNTER — Encounter: Payer: Self-pay | Admitting: Obstetrics & Gynecology

## 2017-05-29 DIAGNOSIS — Z01419 Encounter for gynecological examination (general) (routine) without abnormal findings: Secondary | ICD-10-CM | POA: Diagnosis not present

## 2017-07-20 DIAGNOSIS — E039 Hypothyroidism, unspecified: Secondary | ICD-10-CM | POA: Diagnosis not present

## 2017-07-20 DIAGNOSIS — Z3202 Encounter for pregnancy test, result negative: Secondary | ICD-10-CM | POA: Diagnosis not present

## 2017-07-20 DIAGNOSIS — E063 Autoimmune thyroiditis: Secondary | ICD-10-CM | POA: Diagnosis not present

## 2017-07-20 DIAGNOSIS — Z3200 Encounter for pregnancy test, result unknown: Secondary | ICD-10-CM | POA: Diagnosis not present

## 2017-07-25 DIAGNOSIS — R509 Fever, unspecified: Secondary | ICD-10-CM | POA: Diagnosis not present

## 2017-07-25 DIAGNOSIS — J029 Acute pharyngitis, unspecified: Secondary | ICD-10-CM | POA: Diagnosis not present

## 2017-07-30 DIAGNOSIS — E063 Autoimmune thyroiditis: Secondary | ICD-10-CM | POA: Diagnosis not present

## 2017-07-30 DIAGNOSIS — Z0000 Encounter for general adult medical examination without abnormal findings: Secondary | ICD-10-CM | POA: Diagnosis not present

## 2017-07-30 DIAGNOSIS — J309 Allergic rhinitis, unspecified: Secondary | ICD-10-CM | POA: Diagnosis not present

## 2017-08-27 DIAGNOSIS — E039 Hypothyroidism, unspecified: Secondary | ICD-10-CM | POA: Diagnosis not present

## 2017-10-23 DIAGNOSIS — R5383 Other fatigue: Secondary | ICD-10-CM | POA: Diagnosis not present

## 2017-12-31 DIAGNOSIS — Z713 Dietary counseling and surveillance: Secondary | ICD-10-CM | POA: Diagnosis not present

## 2018-02-05 DIAGNOSIS — R300 Dysuria: Secondary | ICD-10-CM | POA: Diagnosis not present

## 2018-02-05 DIAGNOSIS — E669 Obesity, unspecified: Secondary | ICD-10-CM | POA: Diagnosis not present

## 2018-02-05 DIAGNOSIS — Z713 Dietary counseling and surveillance: Secondary | ICD-10-CM | POA: Diagnosis not present

## 2018-05-06 DIAGNOSIS — N915 Oligomenorrhea, unspecified: Secondary | ICD-10-CM | POA: Diagnosis not present

## 2018-05-06 DIAGNOSIS — R109 Unspecified abdominal pain: Secondary | ICD-10-CM | POA: Diagnosis not present

## 2018-05-06 DIAGNOSIS — R1084 Generalized abdominal pain: Secondary | ICD-10-CM | POA: Diagnosis not present

## 2018-07-19 DIAGNOSIS — E039 Hypothyroidism, unspecified: Secondary | ICD-10-CM | POA: Diagnosis not present

## 2018-07-22 DIAGNOSIS — E039 Hypothyroidism, unspecified: Secondary | ICD-10-CM | POA: Diagnosis not present

## 2018-07-22 DIAGNOSIS — E063 Autoimmune thyroiditis: Secondary | ICD-10-CM | POA: Diagnosis not present

## 2018-08-12 DIAGNOSIS — Z0000 Encounter for general adult medical examination without abnormal findings: Secondary | ICD-10-CM | POA: Diagnosis not present

## 2018-08-12 DIAGNOSIS — Z13220 Encounter for screening for lipoid disorders: Secondary | ICD-10-CM | POA: Diagnosis not present

## 2018-08-12 DIAGNOSIS — Z130 Encounter for screening for diseases of the blood and blood-forming organs and certain disorders involving the immune mechanism: Secondary | ICD-10-CM | POA: Diagnosis not present

## 2018-08-12 DIAGNOSIS — Z01419 Encounter for gynecological examination (general) (routine) without abnormal findings: Secondary | ICD-10-CM | POA: Diagnosis not present

## 2018-08-12 DIAGNOSIS — Z131 Encounter for screening for diabetes mellitus: Secondary | ICD-10-CM | POA: Diagnosis not present

## 2018-08-12 DIAGNOSIS — Z6835 Body mass index (BMI) 35.0-35.9, adult: Secondary | ICD-10-CM | POA: Diagnosis not present

## 2018-08-12 DIAGNOSIS — N911 Secondary amenorrhea: Secondary | ICD-10-CM | POA: Diagnosis not present

## 2018-08-23 DIAGNOSIS — E063 Autoimmune thyroiditis: Secondary | ICD-10-CM | POA: Diagnosis not present

## 2018-08-23 DIAGNOSIS — E2839 Other primary ovarian failure: Secondary | ICD-10-CM | POA: Diagnosis not present

## 2018-08-23 DIAGNOSIS — E039 Hypothyroidism, unspecified: Secondary | ICD-10-CM | POA: Diagnosis not present

## 2018-08-27 DIAGNOSIS — E2839 Other primary ovarian failure: Secondary | ICD-10-CM | POA: Diagnosis not present

## 2018-09-27 DIAGNOSIS — E039 Hypothyroidism, unspecified: Secondary | ICD-10-CM | POA: Diagnosis not present

## 2018-09-27 DIAGNOSIS — E063 Autoimmune thyroiditis: Secondary | ICD-10-CM | POA: Diagnosis not present

## 2018-09-27 DIAGNOSIS — E2839 Other primary ovarian failure: Secondary | ICD-10-CM | POA: Diagnosis not present

## 2018-10-16 DIAGNOSIS — E039 Hypothyroidism, unspecified: Secondary | ICD-10-CM | POA: Diagnosis not present

## 2018-10-16 DIAGNOSIS — Z319 Encounter for procreative management, unspecified: Secondary | ICD-10-CM | POA: Diagnosis not present

## 2018-10-25 DIAGNOSIS — N912 Amenorrhea, unspecified: Secondary | ICD-10-CM | POA: Diagnosis not present

## 2018-10-25 DIAGNOSIS — E2839 Other primary ovarian failure: Secondary | ICD-10-CM | POA: Diagnosis not present

## 2018-11-06 DIAGNOSIS — E2839 Other primary ovarian failure: Secondary | ICD-10-CM | POA: Diagnosis not present

## 2019-03-13 ENCOUNTER — Other Ambulatory Visit: Payer: Self-pay | Admitting: Emergency Medicine

## 2019-03-13 DIAGNOSIS — Z20822 Contact with and (suspected) exposure to covid-19: Secondary | ICD-10-CM

## 2019-03-14 LAB — NOVEL CORONAVIRUS, NAA: SARS-CoV-2, NAA: NOT DETECTED

## 2021-08-11 ENCOUNTER — Other Ambulatory Visit: Payer: Self-pay | Admitting: Internal Medicine

## 2021-08-11 DIAGNOSIS — Z1231 Encounter for screening mammogram for malignant neoplasm of breast: Secondary | ICD-10-CM

## 2021-12-09 ENCOUNTER — Ambulatory Visit
Admission: RE | Admit: 2021-12-09 | Discharge: 2021-12-09 | Disposition: A | Discharge status: Home / Self Care | Payer: BC Managed Care – PPO | Source: Ambulatory Visit | Attending: Internal Medicine | Admitting: Internal Medicine

## 2021-12-09 DIAGNOSIS — Z1231 Encounter for screening mammogram for malignant neoplasm of breast: Secondary | ICD-10-CM

## 2023-06-25 ENCOUNTER — Other Ambulatory Visit: Payer: Self-pay | Admitting: Internal Medicine

## 2023-06-25 DIAGNOSIS — Z1231 Encounter for screening mammogram for malignant neoplasm of breast: Secondary | ICD-10-CM

## 2023-07-05 ENCOUNTER — Other Ambulatory Visit: Payer: Self-pay | Admitting: Internal Medicine

## 2023-07-05 DIAGNOSIS — N644 Mastodynia: Secondary | ICD-10-CM

## 2023-08-08 ENCOUNTER — Ambulatory Visit: Payer: BC Managed Care – PPO

## 2023-08-08 ENCOUNTER — Ambulatory Visit
Admission: RE | Admit: 2023-08-08 | Discharge: 2023-08-08 | Disposition: A | Discharge status: Home / Self Care | Payer: 59 | Source: Ambulatory Visit | Attending: Internal Medicine | Admitting: Internal Medicine

## 2023-08-08 DIAGNOSIS — N644 Mastodynia: Secondary | ICD-10-CM
